# Patient Record
Sex: Female | Born: 1975 | Race: Black or African American | Hispanic: No | Smoking: Former smoker
Health system: Southern US, Community
[De-identification: ages and names within clinical notes are randomized; demographics above are authoritative.]

## PROBLEM LIST (undated history)

## (undated) DIAGNOSIS — G43909 Migraine, unspecified, not intractable, without status migrainosus: Secondary | ICD-10-CM

## (undated) DIAGNOSIS — J45909 Unspecified asthma, uncomplicated: Secondary | ICD-10-CM

## (undated) DIAGNOSIS — I10 Essential (primary) hypertension: Secondary | ICD-10-CM

## (undated) HISTORY — PX: ABDOMINAL HYSTERECTOMY: SHX81

## (undated) HISTORY — PX: KNEE ARTHROSCOPY: SHX127

## (undated) HISTORY — PX: OVARIAN CYST REMOVAL: SHX89

## (undated) HISTORY — PX: CHOLECYSTECTOMY: SHX55

---

## 1998-08-03 ENCOUNTER — Emergency Department (HOSPITAL_COMMUNITY): Admission: EM | Admit: 1998-08-03 | Discharge: 1998-08-03 | Payer: Self-pay | Admitting: Emergency Medicine

## 1999-01-30 ENCOUNTER — Emergency Department (HOSPITAL_COMMUNITY): Admission: EM | Admit: 1999-01-30 | Discharge: 1999-01-30 | Payer: Self-pay | Admitting: *Deleted

## 2002-09-19 ENCOUNTER — Encounter: Admission: RE | Admit: 2002-09-19 | Discharge: 2002-09-19 | Payer: Self-pay | Admitting: Family Medicine

## 2003-03-06 ENCOUNTER — Encounter: Admission: RE | Admit: 2003-03-06 | Discharge: 2003-03-06 | Payer: Self-pay | Admitting: Family Medicine

## 2003-03-06 ENCOUNTER — Encounter (INDEPENDENT_AMBULATORY_CARE_PROVIDER_SITE_OTHER): Payer: Self-pay | Admitting: *Deleted

## 2003-03-25 ENCOUNTER — Ambulatory Visit (HOSPITAL_COMMUNITY): Admission: RE | Admit: 2003-03-25 | Discharge: 2003-03-25 | Payer: Self-pay | Admitting: Obstetrics and Gynecology

## 2003-10-08 ENCOUNTER — Encounter: Admission: RE | Admit: 2003-10-08 | Discharge: 2003-10-08 | Payer: Self-pay | Admitting: Obstetrics and Gynecology

## 2009-02-16 ENCOUNTER — Emergency Department (HOSPITAL_COMMUNITY): Admission: EM | Admit: 2009-02-16 | Discharge: 2009-02-16 | Payer: Self-pay | Admitting: Emergency Medicine

## 2009-02-27 ENCOUNTER — Emergency Department (HOSPITAL_COMMUNITY): Admission: EM | Admit: 2009-02-27 | Discharge: 2009-02-27 | Payer: Self-pay | Admitting: Emergency Medicine

## 2009-04-05 ENCOUNTER — Emergency Department (HOSPITAL_COMMUNITY): Admission: EM | Admit: 2009-04-05 | Discharge: 2009-04-05 | Payer: Self-pay | Admitting: Emergency Medicine

## 2009-04-13 ENCOUNTER — Emergency Department (HOSPITAL_COMMUNITY): Admission: EM | Admit: 2009-04-13 | Discharge: 2009-04-13 | Payer: Self-pay | Admitting: Emergency Medicine

## 2010-07-17 ENCOUNTER — Emergency Department (HOSPITAL_COMMUNITY)
Admission: EM | Admit: 2010-07-17 | Discharge: 2010-07-17 | Disposition: A | Discharge status: Home / Self Care | Payer: Medicaid Other | Attending: Emergency Medicine | Admitting: Emergency Medicine

## 2010-07-17 DIAGNOSIS — R110 Nausea: Secondary | ICD-10-CM | POA: Insufficient documentation

## 2010-07-17 DIAGNOSIS — E669 Obesity, unspecified: Secondary | ICD-10-CM | POA: Insufficient documentation

## 2010-07-17 DIAGNOSIS — F3289 Other specified depressive episodes: Secondary | ICD-10-CM | POA: Insufficient documentation

## 2010-07-17 DIAGNOSIS — H53149 Visual discomfort, unspecified: Secondary | ICD-10-CM | POA: Insufficient documentation

## 2010-07-17 DIAGNOSIS — I10 Essential (primary) hypertension: Secondary | ICD-10-CM | POA: Insufficient documentation

## 2010-07-17 DIAGNOSIS — F329 Major depressive disorder, single episode, unspecified: Secondary | ICD-10-CM | POA: Insufficient documentation

## 2010-07-17 DIAGNOSIS — G43909 Migraine, unspecified, not intractable, without status migrainosus: Secondary | ICD-10-CM | POA: Insufficient documentation

## 2010-07-17 DIAGNOSIS — J45909 Unspecified asthma, uncomplicated: Secondary | ICD-10-CM | POA: Insufficient documentation

## 2010-07-17 DIAGNOSIS — Z79899 Other long term (current) drug therapy: Secondary | ICD-10-CM | POA: Insufficient documentation

## 2010-07-24 ENCOUNTER — Emergency Department (HOSPITAL_COMMUNITY)
Admission: EM | Admit: 2010-07-24 | Discharge: 2010-07-24 | Disposition: A | Discharge status: Home / Self Care | Payer: Medicaid Other | Attending: Emergency Medicine | Admitting: Emergency Medicine

## 2010-07-24 DIAGNOSIS — M545 Low back pain, unspecified: Secondary | ICD-10-CM | POA: Insufficient documentation

## 2010-07-24 DIAGNOSIS — Z79899 Other long term (current) drug therapy: Secondary | ICD-10-CM | POA: Insufficient documentation

## 2010-07-24 DIAGNOSIS — F3289 Other specified depressive episodes: Secondary | ICD-10-CM | POA: Insufficient documentation

## 2010-07-24 DIAGNOSIS — I10 Essential (primary) hypertension: Secondary | ICD-10-CM | POA: Insufficient documentation

## 2010-07-24 DIAGNOSIS — J45909 Unspecified asthma, uncomplicated: Secondary | ICD-10-CM | POA: Insufficient documentation

## 2010-07-24 DIAGNOSIS — F329 Major depressive disorder, single episode, unspecified: Secondary | ICD-10-CM | POA: Insufficient documentation

## 2010-07-24 DIAGNOSIS — M5430 Sciatica, unspecified side: Secondary | ICD-10-CM | POA: Insufficient documentation

## 2010-07-24 DIAGNOSIS — M79609 Pain in unspecified limb: Secondary | ICD-10-CM | POA: Insufficient documentation

## 2010-07-24 DIAGNOSIS — R209 Unspecified disturbances of skin sensation: Secondary | ICD-10-CM | POA: Insufficient documentation

## 2010-08-20 ENCOUNTER — Emergency Department (HOSPITAL_COMMUNITY): Payer: Medicaid Other

## 2010-08-20 ENCOUNTER — Emergency Department (HOSPITAL_COMMUNITY)
Admission: EM | Admit: 2010-08-20 | Discharge: 2010-08-21 | Disposition: A | Discharge status: Home / Self Care | Payer: Medicaid Other | Attending: Emergency Medicine | Admitting: Emergency Medicine

## 2010-08-20 DIAGNOSIS — M79609 Pain in unspecified limb: Secondary | ICD-10-CM | POA: Insufficient documentation

## 2010-08-20 DIAGNOSIS — M545 Low back pain, unspecified: Secondary | ICD-10-CM | POA: Insufficient documentation

## 2010-08-20 DIAGNOSIS — M549 Dorsalgia, unspecified: Secondary | ICD-10-CM | POA: Insufficient documentation

## 2010-08-20 DIAGNOSIS — I10 Essential (primary) hypertension: Secondary | ICD-10-CM | POA: Insufficient documentation

## 2010-09-03 ENCOUNTER — Emergency Department (HOSPITAL_COMMUNITY)
Admission: EM | Admit: 2010-09-03 | Discharge: 2010-09-04 | Disposition: A | Discharge status: Home / Self Care | Payer: Medicaid Other | Attending: Emergency Medicine | Admitting: Emergency Medicine

## 2010-09-03 DIAGNOSIS — I10 Essential (primary) hypertension: Secondary | ICD-10-CM | POA: Insufficient documentation

## 2010-09-03 DIAGNOSIS — M549 Dorsalgia, unspecified: Secondary | ICD-10-CM | POA: Insufficient documentation

## 2010-09-03 DIAGNOSIS — R109 Unspecified abdominal pain: Secondary | ICD-10-CM | POA: Insufficient documentation

## 2010-09-03 DIAGNOSIS — J45909 Unspecified asthma, uncomplicated: Secondary | ICD-10-CM | POA: Insufficient documentation

## 2010-09-03 DIAGNOSIS — R110 Nausea: Secondary | ICD-10-CM | POA: Insufficient documentation

## 2010-09-03 DIAGNOSIS — F3289 Other specified depressive episodes: Secondary | ICD-10-CM | POA: Insufficient documentation

## 2010-09-03 DIAGNOSIS — R000 Tachycardia, unspecified: Secondary | ICD-10-CM | POA: Insufficient documentation

## 2010-09-03 DIAGNOSIS — F329 Major depressive disorder, single episode, unspecified: Secondary | ICD-10-CM | POA: Insufficient documentation

## 2010-09-03 DIAGNOSIS — N949 Unspecified condition associated with female genital organs and menstrual cycle: Secondary | ICD-10-CM | POA: Insufficient documentation

## 2010-09-03 LAB — BASIC METABOLIC PANEL
BUN: 3 mg/dL — ABNORMAL LOW (ref 6–23)
CO2: 24 mEq/L (ref 19–32)
Calcium: 8.7 mg/dL (ref 8.4–10.5)
Chloride: 104 mEq/L (ref 96–112)
Creatinine, Ser: 0.75 mg/dL (ref 0.4–1.2)
GFR calc Af Amer: >60 mL/min (ref >60)
GFR calc non Af Amer: >60 mL/min (ref >60)
Glucose, Bld: 104 mg/dL — ABNORMAL HIGH (ref 70–99)
Potassium: 4.2 mEq/L (ref 3.5–5.1)
Sodium: 132 mEq/L — ABNORMAL LOW (ref 135–145)

## 2010-09-03 LAB — WET PREP, GENITAL
Clue Cells Wet Prep HPF POC: NONE SEEN
Trich, Wet Prep: NONE SEEN
WBC, Wet Prep HPF POC: NONE SEEN
Yeast Wet Prep HPF POC: NONE SEEN

## 2010-09-03 LAB — CBC
HCT: 34.9 % — ABNORMAL LOW (ref 36.0–46.0)
Hemoglobin: 10.9 g/dL — ABNORMAL LOW (ref 12.0–15.0)
MCH: 22.9 pg — ABNORMAL LOW (ref 26.0–34.0)
MCHC: 31.2 g/dL (ref 30.0–36.0)
MCV: 73.3 fL — ABNORMAL LOW (ref 78.0–100.0)
Platelets: 332 10*3/uL (ref 150–400)
RBC: 4.76 MIL/uL (ref 3.87–5.11)
RDW: 16.1 % — ABNORMAL HIGH (ref 11.5–15.5)
WBC: 11.3 10*3/uL — ABNORMAL HIGH (ref 4.0–10.5)

## 2010-09-03 LAB — URINALYSIS, ROUTINE W REFLEX MICROSCOPIC
Bilirubin Urine: NEGATIVE
Glucose, UA: NEGATIVE mg/dL
Hgb urine dipstick: NEGATIVE
Ketones, ur: NEGATIVE mg/dL
Nitrite: NEGATIVE
Protein, ur: NEGATIVE mg/dL
Specific Gravity, Urine: 1.017 (ref 1.005–1.030)
Urobilinogen, UA: 1 mg/dL (ref 0.0–1.0)
pH: 8.5 — ABNORMAL HIGH (ref 5.0–8.0)

## 2010-09-03 LAB — DIFFERENTIAL
Basophils Absolute: 0 10*3/uL (ref 0.0–0.1)
Basophils Relative: 0 % (ref 0–1)
Eosinophils Absolute: 0.1 10*3/uL (ref 0.0–0.7)
Eosinophils Relative: 1 % (ref 0–5)
Lymphocytes Relative: 13 % (ref 12–46)
Lymphs Abs: 1.4 10*3/uL (ref 0.7–4.0)
Monocytes Absolute: 0.6 10*3/uL (ref 0.1–1.0)
Monocytes Relative: 5 % (ref 3–12)
Neutro Abs: 9.1 10*3/uL — ABNORMAL HIGH (ref 1.7–7.7)
Neutrophils Relative %: 81 % — ABNORMAL HIGH (ref 43–77)

## 2010-09-03 LAB — POCT PREGNANCY, URINE: Preg Test, Ur: NEGATIVE

## 2010-09-06 LAB — GC/CHLAMYDIA PROBE AMP, GENITAL
Chlamydia, DNA Probe: UNDETERMINED
GC Probe Amp, Genital: NEGATIVE

## 2010-09-30 NOTE — Group Therapy Note (Signed)
NAME:  Regina [name], Regina [name] NO.:  [id number]   MEDICAL RECORD NO.:  [id number]                   PATIENT TYPE:  OUT   LOCATION:  [facility]                           FACILITY:  [facility]   PHYSICIAN:  Argentina Donovan, MD                     DATE OF BIRTH:  1976/01/01   DATE OF SERVICE:  10/08/2003                                    CLINIC NOTE   REASON FOR VISIT:  The patient is a 35 year old gravida 1 para 1-0-0-1 by  cesarean section that was done because of hypertension and the baby is 23  years old.  Since the birth of her last baby she has been having  intermittent suprapubic pain mainly in the area of the previous scar in the  mid portion of the lower abdomen with what she describes as an increasingly-  severe pressure that often is unpredictable in its frequency and it gets to  the point that she must get off her feet, the pressure becomes so severe.  It is not a stabbing pain but a very, very severe pressure.  It does not  cause increasing urinary frequency and is not helped by voiding.  She has  seen a urologist in the past whose examination was negative but in order to  rule out interstitial cystitis which he said it may come to, he felt the  patient would have to be put to sleep and the bladder dilated.  The pain  does seem to increase, however, when the bladder is really full.  It is not  related to her periods and pelvic examination and ultrasound done some time  ago was negative, yet the patient continues to have these problems.  It has  gotten more frequent recently and she has come in to evaluate it.   PHYSICAL EXAMINATION:  The abdomen is soft, flat, nontender, no masses, no  organomegaly.  The patient is not really having the pain at this time but  she does point to the mid portion of the suprapubic area just near the  surgical scar.  On pelvic examination, bimanual examination, we can move the  uterus and push on the adnexa and she says this is not  where the pain is.  When we push on the bladder in the area of the anterior bladder and urethra,  this does not duplicate the pain, but higher up in the vagina on the  anterior wall she says that is what the pain feels like and that is where it  is.   My impression is this probably is interstitial cystitis and the patient has  been seen so frequently I think it is worth trying her on [place].  We are  going to give her 100 mg t.i.d. with the instructions to take it as dictated  by the PDR.  I want the patient to return in 2 months to see if she has  been  helped by this.  If not, I think that perhaps she deserves at least a  laparoscopy and perhaps even an exam under anesthesia by a urologist,  perhaps both done at the same time.   IMPRESSION:  Abdominal pain of unknown etiology, probable interstitial  cystitis.                                               Argentina Donovan, MD    PR/MEDQ  D:  10/08/2003  T:  10/08/2003  Job:  [id number]

## 2010-09-30 NOTE — Group Therapy Note (Signed)
   Regina, [name] NO.:  [id number]   MEDICAL RECORD NO.:  [id number]                   PATIENT TYPE:  OUT   LOCATION:  [facility]                           FACILITY:  [facility]   PHYSICIAN:  Regina Parents, MD                 DATE OF BIRTH:  September 11, 1975   DATE OF SERVICE:                                    CLINIC NOTE   HISTORY OF PRESENT ILLNESS:  This 35 year old gravida 1, para 1 returns for  follow-up of pelvic pain.  The pain has been going on for approximately four  to six months.  The patient denies any history of pelvic inflammatory  disease, although she was treated for a course of PID as noted in Tinnie Gens, MD history of Sep 19, 2002.  The patient states that she has urinary  frequency, nocturia x5, and occasional dysuria and she also has suprapubic  discomfort when she has a full bladder that she interprets as pain as well  as fullness.  A pelvic ultrasound done in [facility] on August 19, 2002  revealed a 2 x 2 cm follicle cyst of the right ovary and also described both  ovaries being adherent to the posterior leaf of the broad ligament.   PHYSICAL EXAMINATION:  PELVIC:  Vagina is clean.  Cervix is nulliparous and  clean.  The uterus is normal size, nontender, mobile.  Both adnexa are soft  without masses, induration, nodularity, or tenderness.  There is some  suprapubic tenderness to the bladder on compression that the patient  interprets as not only pressure, but also discomfort.   ASSESSMENT/PLAN:  Proposed diagnostic studies will include a repeat pelvic  ultrasound at the patient's request to determine the status of that right  follicle cyst, a clean catch urine for culture, and a referral to urologist  to rule out interstitial cystitis.  If the pelvic ultrasound and the culture  are positive the patient will be notified.  Otherwise, she is to see the  urologist as stated previously.     Regina Parents, MD    SA/MEDQ  D:  03/06/2003  T:  03/06/2003  Job:  [id number]

## 2012-07-05 IMAGING — CR DG LUMBAR SPINE COMPLETE 4+V
5 series · 5 of 5 positions shown · non-contrast
Comparison: None.

CLINICAL DATA: Fall 1 week ago.  Low back and bilateral leg pain.

LUMBAR SPINE - COMPLETE 4+ VIEW

[t l-spine a.p.]
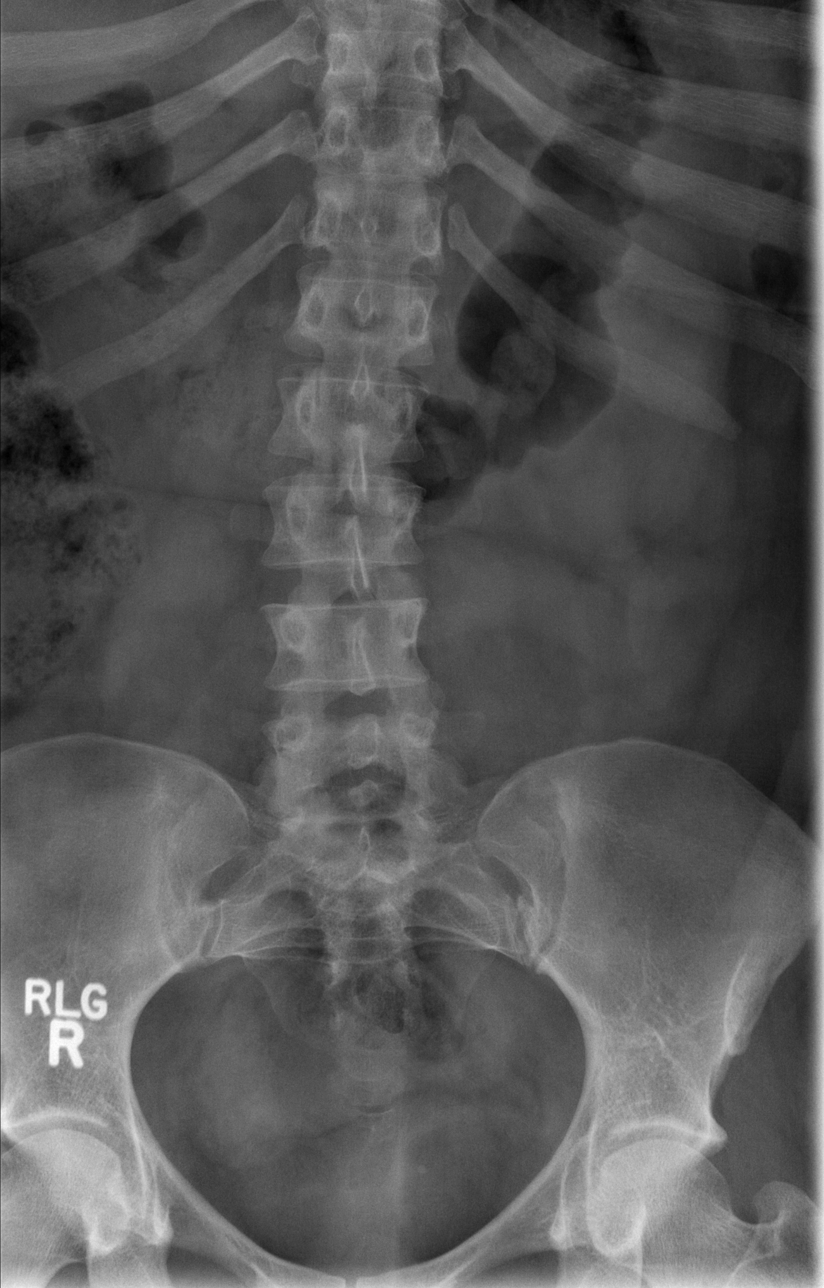

[t l-spine oblique exposure (1 of 2)]
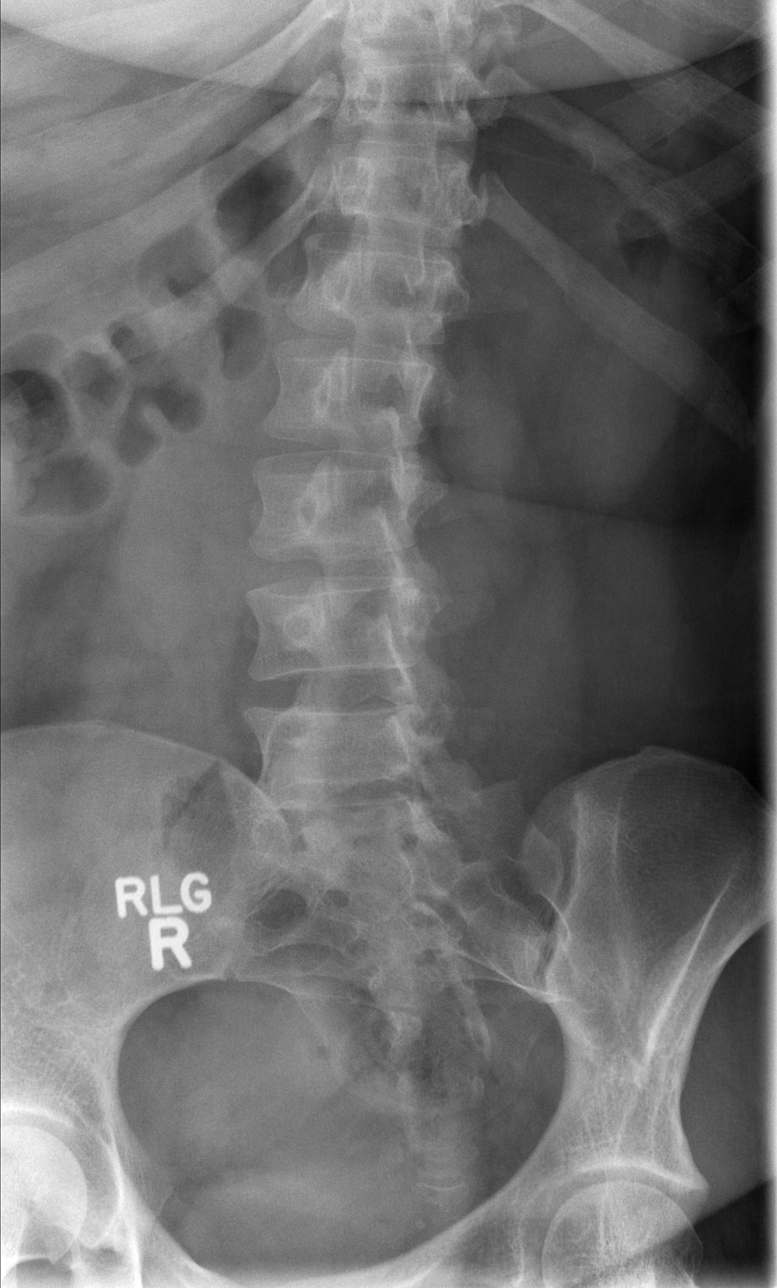

[t l-spine oblique exposure (2 of 2)]
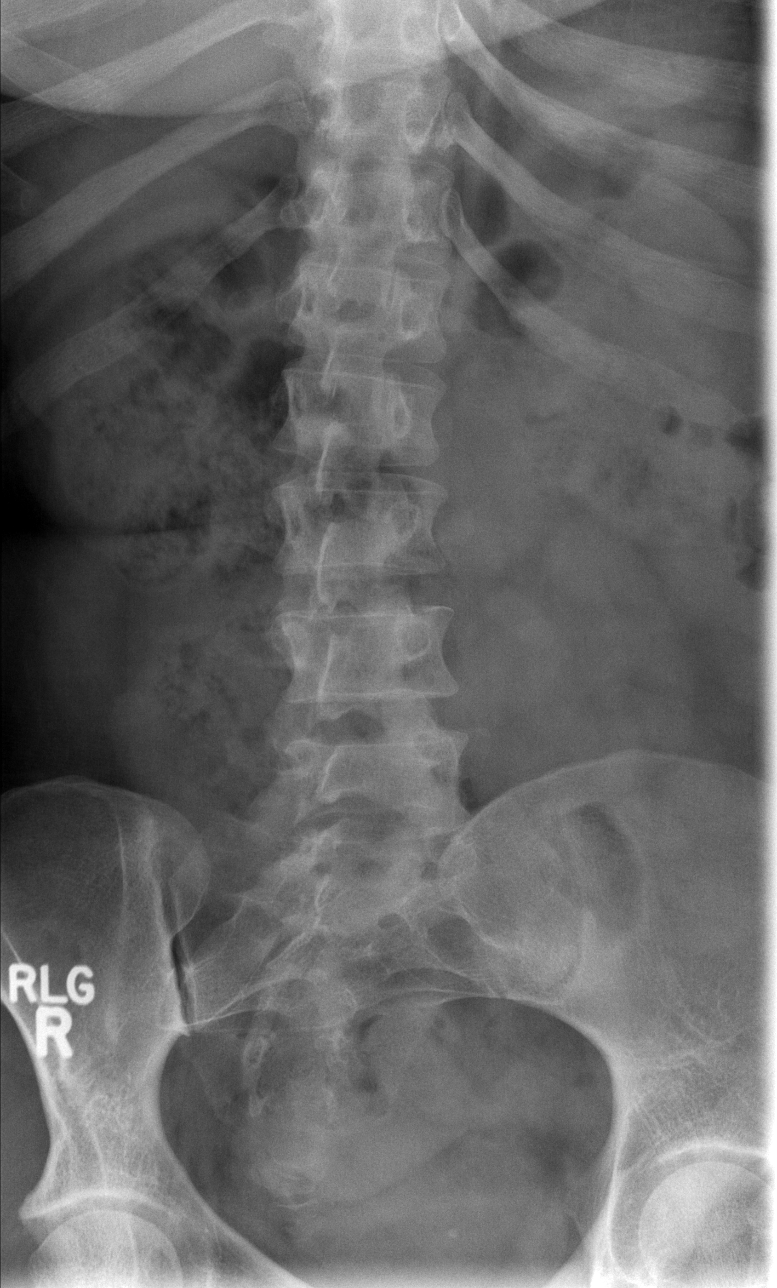

[t l-spine lat]
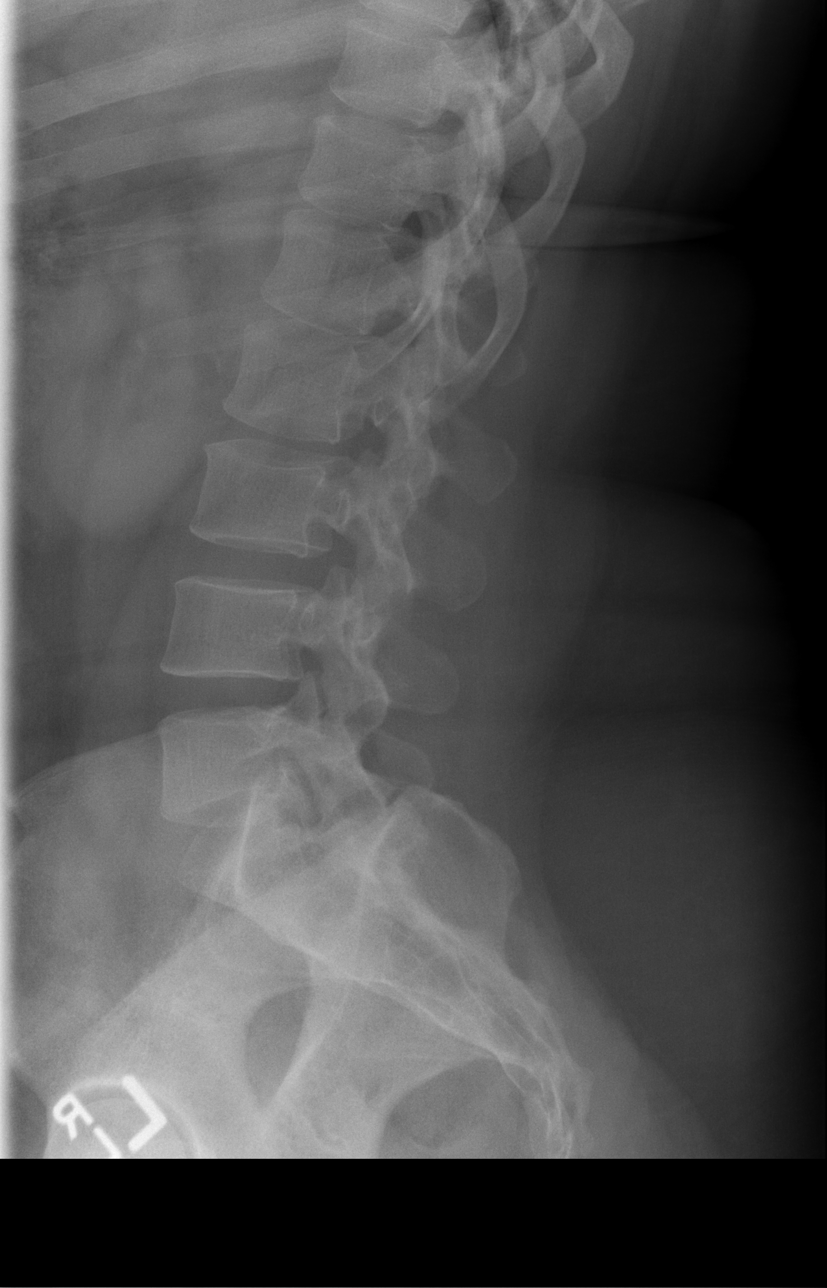

[t l-spine l5-s1 spot]
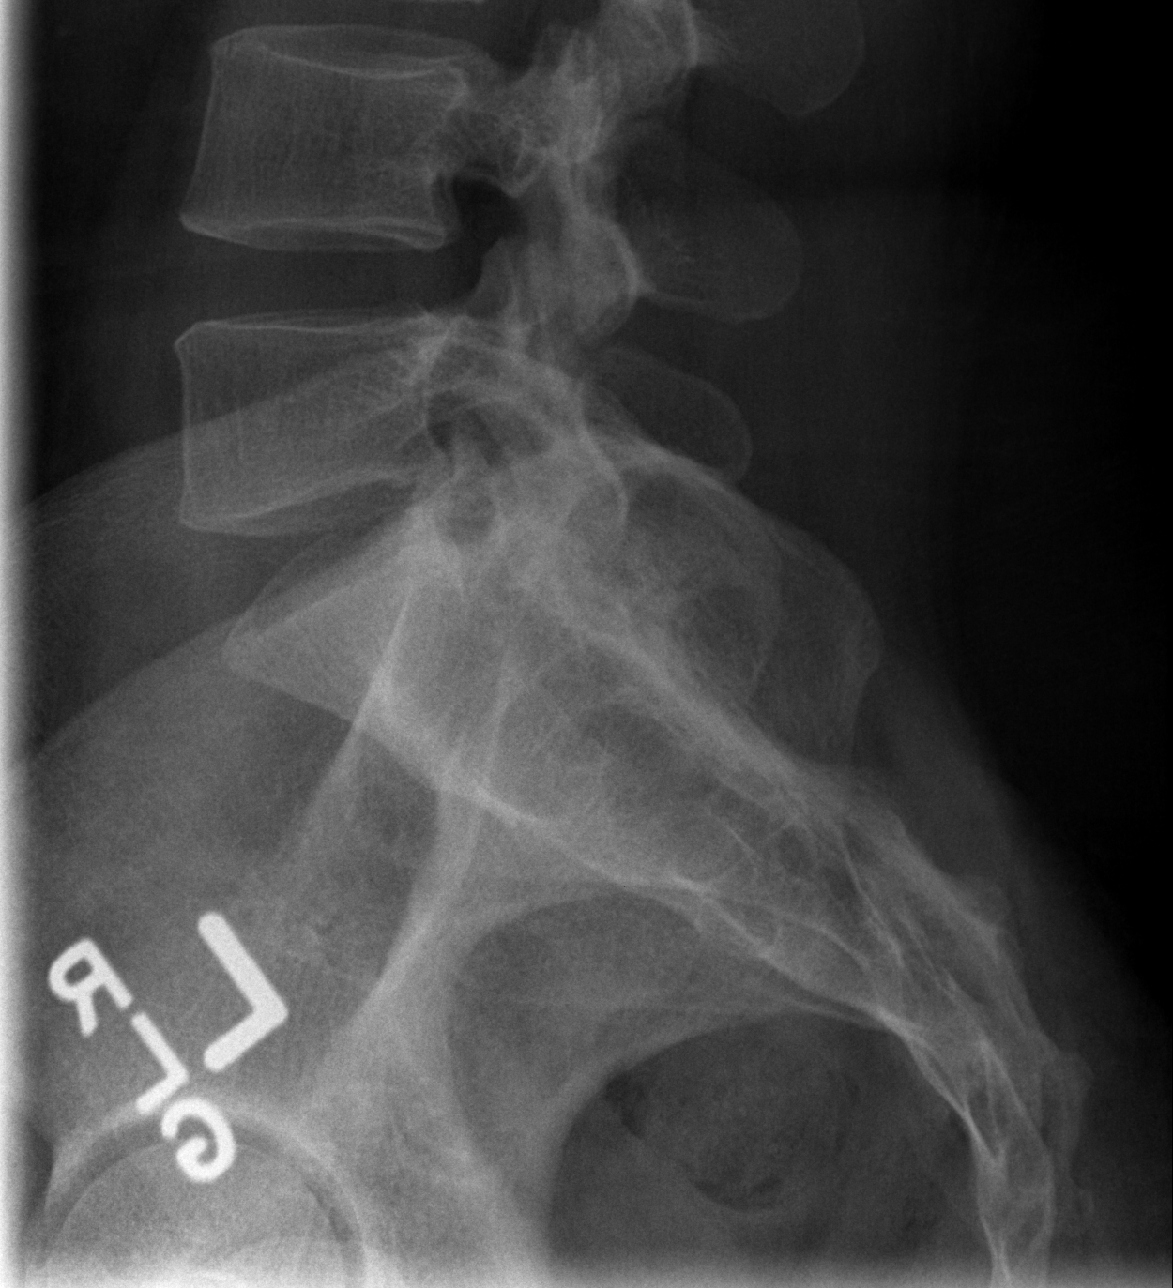

[5 of 5 positions shown; findings below may reference images not displayed]

FINDINGS: Four views study shows no acute fracture.  No
subluxation.  Intervertebral disc spaces are preserved.  The facets
are well-aligned bilaterally.  SI joints are normal in appearance.
IMPRESSION: No acute bony findings.

## 2014-04-03 ENCOUNTER — Emergency Department (HOSPITAL_BASED_OUTPATIENT_CLINIC_OR_DEPARTMENT_OTHER)
Admission: EM | Admit: 2014-04-03 | Discharge: 2014-04-03 | Disposition: A | Discharge status: Home / Self Care | Payer: Medicaid Other | Attending: Emergency Medicine | Admitting: Emergency Medicine

## 2014-04-03 ENCOUNTER — Encounter (HOSPITAL_BASED_OUTPATIENT_CLINIC_OR_DEPARTMENT_OTHER): Payer: Self-pay

## 2014-04-03 DIAGNOSIS — E669 Obesity, unspecified: Secondary | ICD-10-CM | POA: Diagnosis not present

## 2014-04-03 DIAGNOSIS — R51 Headache: Secondary | ICD-10-CM | POA: Diagnosis present

## 2014-04-03 DIAGNOSIS — G43909 Migraine, unspecified, not intractable, without status migrainosus: Secondary | ICD-10-CM | POA: Insufficient documentation

## 2014-04-03 DIAGNOSIS — Z791 Long term (current) use of non-steroidal anti-inflammatories (NSAID): Secondary | ICD-10-CM | POA: Insufficient documentation

## 2014-04-03 DIAGNOSIS — I10 Essential (primary) hypertension: Secondary | ICD-10-CM | POA: Insufficient documentation

## 2014-04-03 DIAGNOSIS — Z7951 Long term (current) use of inhaled steroids: Secondary | ICD-10-CM | POA: Diagnosis not present

## 2014-04-03 DIAGNOSIS — Z79899 Other long term (current) drug therapy: Secondary | ICD-10-CM | POA: Diagnosis not present

## 2014-04-03 DIAGNOSIS — Z880 Allergy status to penicillin: Secondary | ICD-10-CM | POA: Diagnosis not present

## 2014-04-03 DIAGNOSIS — J45909 Unspecified asthma, uncomplicated: Secondary | ICD-10-CM | POA: Diagnosis not present

## 2014-04-03 HISTORY — DX: Unspecified asthma, uncomplicated: J45.909

## 2014-04-03 HISTORY — DX: Essential (primary) hypertension: I10

## 2014-04-03 HISTORY — DX: Migraine, unspecified, not intractable, without status migrainosus: G43.909

## 2014-04-03 MED ORDER — METOCLOPRAMIDE HCL 5 MG/ML IJ SOLN
10.0000 mg | Freq: Once | INTRAMUSCULAR | Status: AC
  Administered 2014-04-03: 10 mg via INTRAVENOUS
  Filled 2014-04-03: qty 2

## 2014-04-03 MED ORDER — DIPHENHYDRAMINE HCL 50 MG/ML IJ SOLN
25.0000 mg | Freq: Once | INTRAMUSCULAR | Status: AC
  Administered 2014-04-03: 25 mg via INTRAVENOUS
  Filled 2014-04-03: qty 1

## 2014-04-03 MED ORDER — KETOROLAC TROMETHAMINE 30 MG/ML IJ SOLN
30.0000 mg | Freq: Once | INTRAMUSCULAR | Status: AC
  Administered 2014-04-03: 30 mg via INTRAVENOUS
  Filled 2014-04-03: qty 1

## 2014-04-03 MED ORDER — SODIUM CHLORIDE 0.9 % IV BOLUS (SEPSIS)
1000.0000 mL | Freq: Once | INTRAVENOUS | Status: AC
  Administered 2014-04-03: 1000 mL via INTRAVENOUS

## 2014-04-03 NOTE — ED Provider Notes (Signed)
CSN: [id number]     Arrival date & time 04/03/14  [id number] History   First MD Initiated Contact with Patient 04/03/14 1202     Chief Complaint  Patient presents with  . Headache     (Consider location/radiation/quality/duration/timing/severity/associated sxs/prior Treatment) HPI   38 year old female with history of hypertension, recurrent headache, and asthma who presents complaining of headache. Patient reports gradual onset of sharp throbbing headache throughout her head ongoing for the past 2 days. Headache is currently 8 out of 10, persistent, with light and sound sensitivity. Patient reports nausea without vomiting. She reported occasional shortness of breath and wheezing. She reports having pins and needles feeling in both eyes. Symptoms felt similar to prior migraine headache. She tries taking her home headache cocktail including Topamax, diclofenac with minimal improvement. She denies having any fever, chills, double vision, neck stiffness, difficulty thinking, slurring of speech, numbness weakness or rash. She has a neurologist. She is currently on her menstrual period and reported heavy flow with clots. This is similar to last month.  Past Medical History  Diagnosis Date  . Asthma   . Migraine   . Hypertension    Past Surgical History  Procedure Laterality Date  . Ovarian cyst removal    . Cholecystectomy    . Cesarean section    . Knee arthroscopy     No family history on file. History  Substance Use Topics  . Smoking status: Never Smoker   . Smokeless tobacco: Not on file  . Alcohol Use: No   OB History    No data available     Review of Systems  All other systems reviewed and are negative.     Allergies  Penicillins  Home Medications   Prior to Admission medications   Medication Sig Start Date End Date Taking? Authorizing Provider  albuterol (PROVENTIL HFA;VENTOLIN HFA) 108 (90 BASE) MCG/ACT inhaler Inhale into the lungs every 6 (six) hours as needed for  wheezing or shortness of breath.   Yes Historical Provider, MD  beclomethasone (QVAR) 40 MCG/ACT inhaler Inhale into the lungs 2 (two) times daily.   Yes Historical Provider, MD  diclofenac (VOLTAREN) 75 MG EC tablet Take 75 mg by mouth 2 (two) times daily.   Yes Historical Provider, MD  topiramate (TOPAMAX) 25 MG capsule Take 25 mg by mouth 2 (two) times daily.   Yes Historical Provider, MD   BP 160/96 mmHg  Pulse 89  Temp(Src) 98 F (36.7 C) (Oral)  Resp 20  Ht 5\' 2"  (1.575 m)  Wt 240 lb (108.863 kg)  BMI 43.89 kg/m2  SpO2 100%  LMP 04/03/2014 Physical Exam  Constitutional: She is oriented to person, place, and time. She appears well-developed and well-nourished. No distress.  Moderately obese African-American female is appears to be in no acute distress, nontoxic in appearance.  HENT:  Head: Atraumatic.  Eyes: Conjunctivae and EOM are normal. Pupils are equal, round, and reactive to light.  Neck: Neck supple.  No nuchal rigidity  Cardiovascular: Normal rate and regular rhythm.   Pulmonary/Chest: Effort normal.  Abdominal: Soft. There is no tenderness.  Neurological: She is alert and oriented to person, place, and time.  Neurologic exam:  Speech clear, pupils equal round reactive to light, extraocular movements intact  Normal peripheral visual fields Cranial nerves III through XII normal including no facial droop Follows commands, moves all extremities x4, normal strength to bilateral upper and lower extremities at all major muscle groups including grip Sensation normal to light touch  Coordination intact, no limb ataxia, finger-nose-finger normal Rapid alternating movements normal No pronator drift Gait normal   Skin: No rash noted.  Psychiatric: She has a normal mood and affect.  Nursing note and vitals reviewed.   ED Course  Procedures (including critical care time)  Headache similar to previous, no fever, neck stiffness, neuro findings or new symptoms to suggest  more serious etiology.  I don't think SAH, ICH, meningitis, encephalitis, mass at this time.  No recent trauma.  I don't feel imaging necessary at this time.  Plan to control symptoms.   Labs Review Labs Reviewed - No data to display  Imaging Review No results found.   EKG Interpretation None      MDM   Final diagnoses:  Migraine without status migrainosus, not intractable, unspecified migraine type    BP 142/87 mmHg  Pulse 85  Temp(Src) 98 F (36.7 C) (Oral)  Resp 18  Ht 5\' 2"  (1.575 m)  Wt 240 lb (108.863 kg)  BMI 43.89 kg/m2  SpO2 99%  LMP 04/03/2014     Fayrene HelperBowie Rajvir Ernster, PA-C 04/03/14 2204  Rolan BuccoMelanie Belfi, MD 04/08/14 [phone]

## 2014-04-03 NOTE — ED Notes (Signed)
Pt reports migraine that started two days ago without relief from migraine meds.  Pt also reports shortness of breath and wheezing.  No distress noted in triage.  Normal RR and unlabored breathing.  Pt reports pin feeling in eyes.  Reports this is similar to previous migraines.

## 2014-04-03 NOTE — Discharge Instructions (Signed)

## 2014-04-03 NOTE — ED Notes (Signed)
Pt also is on her period and sts that she is having an usual number of clots but no more pain than usual.

## 2015-10-25 ENCOUNTER — Emergency Department (HOSPITAL_BASED_OUTPATIENT_CLINIC_OR_DEPARTMENT_OTHER)
Admission: EM | Admit: 2015-10-25 | Discharge: 2015-10-26 | Disposition: A | Discharge status: Home / Self Care | Payer: Medicaid Other | Attending: Emergency Medicine | Admitting: Emergency Medicine

## 2015-10-25 ENCOUNTER — Encounter (HOSPITAL_BASED_OUTPATIENT_CLINIC_OR_DEPARTMENT_OTHER): Payer: Self-pay | Admitting: *Deleted

## 2015-10-25 DIAGNOSIS — I10 Essential (primary) hypertension: Secondary | ICD-10-CM | POA: Diagnosis not present

## 2015-10-25 DIAGNOSIS — R1013 Epigastric pain: Secondary | ICD-10-CM | POA: Diagnosis not present

## 2015-10-25 DIAGNOSIS — J45909 Unspecified asthma, uncomplicated: Secondary | ICD-10-CM | POA: Diagnosis not present

## 2015-10-25 DIAGNOSIS — Z79899 Other long term (current) drug therapy: Secondary | ICD-10-CM | POA: Diagnosis not present

## 2015-10-25 DIAGNOSIS — R1084 Generalized abdominal pain: Secondary | ICD-10-CM | POA: Diagnosis present

## 2015-10-25 LAB — URINALYSIS, ROUTINE W REFLEX MICROSCOPIC
Bilirubin Urine: NEGATIVE
Glucose, UA: NEGATIVE mg/dL
Hgb urine dipstick: NEGATIVE
Ketones, ur: NEGATIVE mg/dL
Leukocytes, UA: NEGATIVE
Nitrite: NEGATIVE
Protein, ur: NEGATIVE mg/dL
Specific Gravity, Urine: 1.025 (ref 1.005–1.030)
pH: 6 (ref 5.0–8.0)

## 2015-10-25 LAB — PREGNANCY, URINE: Preg Test, Ur: NEGATIVE

## 2015-10-25 MED ORDER — SUCRALFATE 1 G PO TABS
1.0000 g | ORAL_TABLET | Freq: Once | ORAL | Status: AC
  Administered 2015-10-26: 1 g via ORAL
  Filled 2015-10-25: qty 1

## 2015-10-25 MED ORDER — SODIUM CHLORIDE 0.9 % IV BOLUS (SEPSIS)
1000.0000 mL | Freq: Once | INTRAVENOUS | Status: AC
  Administered 2015-10-25: 1000 mL via INTRAVENOUS

## 2015-10-25 MED ORDER — ONDANSETRON HCL 4 MG/2ML IJ SOLN
4.0000 mg | Freq: Once | INTRAMUSCULAR | Status: AC
  Administered 2015-10-26: 4 mg via INTRAVENOUS
  Filled 2015-10-25: qty 2

## 2015-10-25 NOTE — ED Provider Notes (Signed)
CSN: [id number]     Arrival date & time 10/25/15  2230 History  By signing my name below, I, Soijett Blue, attest that this documentation has been prepared under the direction and in the presence of Paula LibraJohn Mandi Mattioli, MD. Electronically Signed: Soijett Blue, ED Scribe. 10/25/2015. 11:30 PM.   Chief Complaint  Patient presents with  . Abdominal Cramping     The history is provided by the patient. No language interpreter was used.    HPI Comments: Regina [name] is a 40 y.o. female who presents to the Emergency Department complaining of constant, dull, generalized, abdominal cramping onset 3 days ago. She reports that she has had an endoscopy and upper GI series for her symptoms. A nodule was biopsied; she has not been informed of the results of her biopsy yet. Pt notes that she had her gallbladder removed in 2012. Her abdominal pain is worsened following eating. She states that she is having associated symptoms of nausea, appetite change, and loose stools. She states that she been taking omeprazole and Bentyl with no relief for her symptoms. She denies vomiting or diarrhea.   Past Medical History  Diagnosis Date  . Asthma   . Migraine   . Hypertension    Past Surgical History  Procedure Laterality Date  . Ovarian cyst removal    . Cholecystectomy    . Cesarean section    . Knee arthroscopy     History reviewed. No pertinent family history. Social History  Substance Use Topics  . Smoking status: Never Smoker   . Smokeless tobacco: None  . Alcohol Use: No   OB History    No data available     Review of Systems  A complete 10 system review of systems was obtained and all systems are negative except as noted in the HPI and PMH.   Allergies  Penicillins  Home Medications   Prior to Admission medications   Medication Sig Start Date End Date Taking? Authorizing Provider  dicyclomine (BENTYL) 20 MG tablet Take 20 mg by mouth every 6 (six) hours.   Yes Historical Provider, MD   omeprazole (PRILOSEC) 40 MG capsule Take 40 mg by mouth daily.   Yes Historical Provider, MD  albuterol (PROVENTIL HFA;VENTOLIN HFA) 108 (90 BASE) MCG/ACT inhaler Inhale into the lungs every 6 (six) hours as needed for wheezing or shortness of breath.    Historical Provider, MD  beclomethasone (QVAR) 40 MCG/ACT inhaler Inhale into the lungs 2 (two) times daily.    Historical Provider, MD  diclofenac (VOLTAREN) 75 MG EC tablet Take 75 mg by mouth 2 (two) times daily.    Historical Provider, MD  topiramate (TOPAMAX) 25 MG capsule Take 25 mg by mouth 2 (two) times daily.    Historical Provider, MD   BP 163/99 mmHg  Pulse 85  Temp(Src) 98.8 F (37.1 C)  Resp 18  Ht 5\' 2"  (1.575 m)  Wt 256 lb (116.121 kg)  BMI 46.81 kg/m2  SpO2 100%  LMP 10/05/2015   Physical Exam General: Well-developed, well-nourished female in no acute distress; appearance consistent with age of record HENT: normocephalic; atraumatic Eyes: pupils equal, round and reactive to light; extraocular muscles intact Neck: supple Heart: regular rate and rhythm Lungs: clear to auscultation bilaterally Abdomen: soft; nondistended; epigastric and periumbilical tenderness; no masses or hepatosplenomegaly; bowel sounds present Extremities: No deformity; full range of motion; pulses normal Neurologic: Awake, alert and oriented; motor function intact in all extremities and symmetric; no facial droop Skin: Warm and  dry Psychiatric: Normal mood and affect   ED Course  Procedures (including critical care time)   MDM   Nursing notes and vitals signs, including pulse oximetry, reviewed.  Summary of this visit's results, reviewed by myself:  Labs:  Results for orders placed or performed during the hospital encounter of 10/25/15 (from the past 24 hour(s))  Pregnancy, urine     Status: None   Collection Time: 10/25/15 10:40 PM  Result Value Ref Range   Preg Test, Ur NEGATIVE NEGATIVE  Urinalysis, Routine w reflex microscopic  (not at [facility])     Status: Abnormal   Collection Time: 10/25/15 10:40 PM  Result Value Ref Range   Color, Urine YELLOW YELLOW   APPearance CLOUDY (A) CLEAR   Specific Gravity, Urine 1.025 1.005 - 1.030   pH 6.0 5.0 - 8.0   Glucose, UA NEGATIVE NEGATIVE mg/dL   Hgb urine dipstick NEGATIVE NEGATIVE   Bilirubin Urine NEGATIVE NEGATIVE   Ketones, ur NEGATIVE NEGATIVE mg/dL   Protein, ur NEGATIVE NEGATIVE mg/dL   Nitrite NEGATIVE NEGATIVE   Leukocytes, UA NEGATIVE NEGATIVE  CBC with Differential/Platelet     Status: Abnormal   Collection Time: 10/25/15 11:55 PM  Result Value Ref Range   WBC 8.6 4.0 - 10.5 K/uL   RBC 5.20 (H) 3.87 - 5.11 MIL/uL   Hemoglobin 12.3 12.0 - 15.0 g/dL   HCT 78.2 95.6 - 21.3 %   MCV 74.6 (L) 78.0 - 100.0 fL   MCH 23.7 (L) 26.0 - 34.0 pg   MCHC 31.7 30.0 - 36.0 g/dL   RDW 08.6 (H) 57.8 - 46.9 %   Platelets 288 150 - 400 K/uL   Neutrophils Relative % 61 %   Neutro Abs 5.2 1.7 - 7.7 K/uL   Lymphocytes Relative 27 %   Lymphs Abs 2.3 0.7 - 4.0 K/uL   Monocytes Relative 9 %   Monocytes Absolute 0.8 0.1 - 1.0 K/uL   Eosinophils Relative 3 %   Eosinophils Absolute 0.3 0.0 - 0.7 K/uL   Basophils Relative 0 %   Basophils Absolute 0.0 0.0 - 0.1 K/uL  Comprehensive metabolic panel     Status: Abnormal   Collection Time: 10/25/15 11:55 PM  Result Value Ref Range   Sodium 135 135 - 145 mmol/L   Potassium 3.9 3.5 - 5.1 mmol/L   Chloride 106 101 - 111 mmol/L   CO2 24 22 - 32 mmol/L   Glucose, Bld 105 (H) 65 - 99 mg/dL   BUN 8 6 - 20 mg/dL   Creatinine, Ser 6.29 0.44 - 1.00 mg/dL   Calcium 9.3 8.9 - 52.8 mg/dL   Total Protein 7.0 6.5 - 8.1 g/dL   Albumin 3.9 3.5 - 5.0 g/dL   AST 16 15 - 41 U/L   ALT 18 14 - 54 U/L   Alkaline Phosphatase 65 38 - 126 U/L   Total Bilirubin 0.3 0.3 - 1.2 mg/dL   GFR calc non Af Amer >60 >60 mL/min   GFR calc Af Amer >60 >60 mL/min   Anion gap 5 5 - 15  Lipase, blood     Status: None   Collection Time: 10/25/15 11:55 PM  Result  Value Ref Range   Lipase 27 11 - 51 U/L   12:39 AM Partial relief with Carafate orally. Will start patient on Carafate. She is already on Bentyl and Prilosec.  Final diagnoses:  Epigastric pain   I personally performed the services described in this documentation, which was scribed  in my presence. The recorded information has been reviewed and is accurate.     Paula Libra, MD 10/26/15 [phone]

## 2015-10-25 NOTE — ED Notes (Signed)
pt c/o diffuse abd pain and nausea  X 3 days

## 2015-10-26 LAB — CBC WITH DIFFERENTIAL/PLATELET
Basophils Absolute: 0 10*3/uL (ref 0.0–0.1)
Basophils Relative: 0 %
Eosinophils Absolute: 0.3 10*3/uL (ref 0.0–0.7)
Eosinophils Relative: 3 %
HCT: 38.8 % (ref 36.0–46.0)
Hemoglobin: 12.3 g/dL (ref 12.0–15.0)
Lymphocytes Relative: 27 %
Lymphs Abs: 2.3 10*3/uL (ref 0.7–4.0)
MCH: 23.7 pg — ABNORMAL LOW (ref 26.0–34.0)
MCHC: 31.7 g/dL (ref 30.0–36.0)
MCV: 74.6 fL — ABNORMAL LOW (ref 78.0–100.0)
Monocytes Absolute: 0.8 10*3/uL (ref 0.1–1.0)
Monocytes Relative: 9 %
Neutro Abs: 5.2 10*3/uL (ref 1.7–7.7)
Neutrophils Relative %: 61 %
Platelets: 288 10*3/uL (ref 150–400)
RBC: 5.2 MIL/uL — ABNORMAL HIGH (ref 3.87–5.11)
RDW: 19.5 % — ABNORMAL HIGH (ref 11.5–15.5)
WBC: 8.6 10*3/uL (ref 4.0–10.5)

## 2015-10-26 LAB — COMPREHENSIVE METABOLIC PANEL
ALT: 18 U/L (ref 14–54)
AST: 16 U/L (ref 15–41)
Albumin: 3.9 g/dL (ref 3.5–5.0)
Alkaline Phosphatase: 65 U/L (ref 38–126)
Anion gap: 5 (ref 5–15)
BUN: 8 mg/dL (ref 6–20)
CO2: 24 mmol/L (ref 22–32)
Calcium: 9.3 mg/dL (ref 8.9–10.3)
Chloride: 106 mmol/L (ref 101–111)
Creatinine, Ser: 0.73 mg/dL (ref 0.44–1.00)
GFR calc Af Amer: >60 mL/min (ref >60)
GFR calc non Af Amer: >60 mL/min (ref >60)
Glucose, Bld: 105 mg/dL — ABNORMAL HIGH (ref 65–99)
Potassium: 3.9 mmol/L (ref 3.5–5.1)
Sodium: 135 mmol/L (ref 135–145)
Total Bilirubin: 0.3 mg/dL (ref 0.3–1.2)
Total Protein: 7 g/dL (ref 6.5–8.1)

## 2015-10-26 LAB — LIPASE, BLOOD: Lipase: 27 U/L (ref 11–51)

## 2015-10-26 MED ORDER — SUCRALFATE 1 G PO TABS: ORAL_TABLET | ORAL | Status: AC

## 2016-06-03 ENCOUNTER — Encounter (HOSPITAL_BASED_OUTPATIENT_CLINIC_OR_DEPARTMENT_OTHER): Payer: Self-pay | Admitting: Emergency Medicine

## 2016-06-03 ENCOUNTER — Emergency Department (HOSPITAL_BASED_OUTPATIENT_CLINIC_OR_DEPARTMENT_OTHER)
Admission: EM | Admit: 2016-06-03 | Discharge: 2016-06-03 | Disposition: A | Discharge status: Home / Self Care | Payer: Medicaid Other | Attending: Dermatology | Admitting: Dermatology

## 2016-06-03 DIAGNOSIS — M549 Dorsalgia, unspecified: Secondary | ICD-10-CM | POA: Diagnosis not present

## 2016-06-03 DIAGNOSIS — Z5321 Procedure and treatment not carried out due to patient leaving prior to being seen by health care provider: Secondary | ICD-10-CM | POA: Diagnosis not present

## 2016-06-03 DIAGNOSIS — J45909 Unspecified asthma, uncomplicated: Secondary | ICD-10-CM | POA: Insufficient documentation

## 2016-06-03 DIAGNOSIS — I10 Essential (primary) hypertension: Secondary | ICD-10-CM | POA: Insufficient documentation

## 2016-06-03 DIAGNOSIS — Z79899 Other long term (current) drug therapy: Secondary | ICD-10-CM | POA: Insufficient documentation

## 2016-06-03 DIAGNOSIS — M542 Cervicalgia: Secondary | ICD-10-CM | POA: Insufficient documentation

## 2016-06-03 NOTE — ED Triage Notes (Signed)
Pt states neck pain without injury for past 2-3 days, along with muscle stiffness that today resulted in her neck "locking up".  Pt states h/o similar but not as severe.

## 2016-06-03 NOTE — ED Notes (Signed)
Pt called in waiting room x4 with no answer.

## 2016-06-03 NOTE — ED Triage Notes (Signed)
Pt took one zanaflex last night with no relief.

## 2017-05-04 ENCOUNTER — Other Ambulatory Visit: Payer: Self-pay

## 2017-05-04 ENCOUNTER — Emergency Department (HOSPITAL_BASED_OUTPATIENT_CLINIC_OR_DEPARTMENT_OTHER)
Admission: EM | Admit: 2017-05-04 | Discharge: 2017-05-04 | Disposition: A | Discharge status: Home / Self Care | Payer: Medicaid Other | Attending: Emergency Medicine | Admitting: Emergency Medicine

## 2017-05-04 ENCOUNTER — Encounter (HOSPITAL_BASED_OUTPATIENT_CLINIC_OR_DEPARTMENT_OTHER): Payer: Self-pay | Admitting: *Deleted

## 2017-05-04 DIAGNOSIS — J45909 Unspecified asthma, uncomplicated: Secondary | ICD-10-CM | POA: Diagnosis not present

## 2017-05-04 DIAGNOSIS — Z87891 Personal history of nicotine dependence: Secondary | ICD-10-CM | POA: Diagnosis not present

## 2017-05-04 DIAGNOSIS — R110 Nausea: Secondary | ICD-10-CM | POA: Insufficient documentation

## 2017-05-04 DIAGNOSIS — I10 Essential (primary) hypertension: Secondary | ICD-10-CM | POA: Insufficient documentation

## 2017-05-04 DIAGNOSIS — Z79899 Other long term (current) drug therapy: Secondary | ICD-10-CM | POA: Insufficient documentation

## 2017-05-04 DIAGNOSIS — R51 Headache: Secondary | ICD-10-CM | POA: Insufficient documentation

## 2017-05-04 DIAGNOSIS — R519 Headache, unspecified: Secondary | ICD-10-CM

## 2017-05-04 MED ORDER — KETOROLAC TROMETHAMINE 15 MG/ML IJ SOLN
15.0000 mg | Freq: Once | INTRAMUSCULAR | Status: AC
  Administered 2017-05-04: 15 mg via INTRAVENOUS
  Filled 2017-05-04: qty 1

## 2017-05-04 MED ORDER — METOCLOPRAMIDE HCL 5 MG/ML IJ SOLN
10.0000 mg | Freq: Once | INTRAMUSCULAR | Status: AC
  Administered 2017-05-04: 10 mg via INTRAVENOUS
  Filled 2017-05-04: qty 2

## 2017-05-04 MED ORDER — DEXAMETHASONE SODIUM PHOSPHATE 10 MG/ML IJ SOLN
10.0000 mg | Freq: Once | INTRAMUSCULAR | Status: AC
  Administered 2017-05-04: 10 mg via INTRAVENOUS
  Filled 2017-05-04: qty 1

## 2017-05-04 MED ORDER — FENTANYL CITRATE (PF) 100 MCG/2ML IJ SOLN
100.0000 ug | Freq: Once | INTRAMUSCULAR | Status: AC
  Administered 2017-05-04: 100 ug via INTRAVENOUS
  Filled 2017-05-04: qty 2

## 2017-05-04 MED ORDER — SODIUM CHLORIDE 0.9 % IV BOLUS (SEPSIS)
1000.0000 mL | Freq: Once | INTRAVENOUS | Status: AC
  Administered 2017-05-04: 1000 mL via INTRAVENOUS

## 2017-05-04 NOTE — ED Triage Notes (Signed)
Lingering headache that never goes away for 3 weeks.

## 2017-05-04 NOTE — ED Notes (Signed)
Pt discharged to home with family. NAD.  

## 2017-05-04 NOTE — ED Provider Notes (Signed)
[facility] Provider Note: Lowella DellJ. Lane [name], MD, FACEP  CSN: [id number] MRN: [id number] ARRIVAL: 05/04/17 at 0035 ROOM: [facility]   CHIEF COMPLAINT  Headache   HISTORY OF PRESENT ILLNESS  05/04/17 2:22 AM Regina [name] is a 41 y.o. female with a history of migraines.  She is here with a headache for the past 3 weeks.  She has a history of similarly a prolonged migraines in the past.  The headache is described as generalized and dull.  She rates it as a 9 out of 10.  It is worse with palpation of her scalp, movement or exposure to light.  She has had nausea but no vomiting.  She has had a decreased appetite and oral intake.  She denies focal numbness or weakness.   Past Medical History:  Diagnosis Date  . Asthma   . Hypertension   . Migraine     Past Surgical History:  Procedure Laterality Date  . CESAREAN SECTION    . CHOLECYSTECTOMY    . KNEE ARTHROSCOPY    . OVARIAN CYST REMOVAL      History reviewed. No pertinent family history.  Social History   Tobacco Use  . Smoking status: Former Smoker    Last attempt to quit: 04/14/2012    Years since quitting: 5.0  . Smokeless tobacco: Never Used  Substance Use Topics  . Alcohol use: No  . Drug use: No    Prior to Admission medications   Medication Sig Start Date End Date Taking? Authorizing Provider  albuterol (PROVENTIL HFA;VENTOLIN HFA) 108 (90 BASE) MCG/ACT inhaler Inhale into the lungs every 6 (six) hours as needed for wheezing or shortness of breath.    [provider]  amLODipine (NORVASC) 10 MG tablet Take 10 mg by mouth daily.    [provider]  atenolol (TENORMIN) 50 MG tablet Take 50 mg by mouth daily.    [provider]  beclomethasone (QVAR) 40 MCG/ACT inhaler Inhale into the lungs 2 (two) times daily.    [provider]  diclofenac (VOLTAREN) 75 MG EC tablet Take 75 mg by mouth 2 (two) times daily.    [provider]  dicyclomine (BENTYL) 20 MG  tablet Take 20 mg by mouth every 6 (six) hours.    [provider]  omeprazole (PRILOSEC) 40 MG capsule Take 40 mg by mouth daily.    [provider]  sucralfate (CARAFATE) 1 g tablet Take one tablet 3 times daily with meals and again at bedtime. Take omeprazole at least 30 minutes before first dose of Carafate. 10/26/15   Hera Celaya, MD  tiZANidine (ZANAFLEX) 4 MG tablet Take 4 mg by mouth every 6 (six) hours as needed for muscle spasms.    [provider]  topiramate (TOPAMAX) 25 MG capsule Take 25 mg by mouth 2 (two) times daily.    [provider]    Allergies Penicillins   REVIEW OF SYSTEMS  Negative except as noted here or in the History of Present Illness.   PHYSICAL EXAMINATION  Initial Vital Signs Blood pressure 125/78, pulse (!) 101, temperature 98.3 F (36.8 C), temperature source Oral, resp. rate 16, height 5\' 1"  (1.549 m), weight 111.1 kg (245 lb), SpO2 98 %.  Examination General: Well-developed, well-nourished female in no acute distress; appearance consistent with age of record HENT: normocephalic; atraumatic Eyes: pupils equal, round and reactive to light; extraocular muscles intact; phobia Neck: supple Heart: regular rate and rhythm Lungs: clear to auscultation bilaterally Abdomen:  soft; nondistended; nontender; bowel sounds present Extremities: No deformity; full range of motion; pulses normal Neurologic: Awake, alert and oriented; motor function intact in all extremities and symmetric; no facial droop; normal coordination, speech and gait Skin: Warm and dry Psychiatric: Normal mood and affect   RESULTS  Summary of this visit's results, reviewed by myself:   EKG Interpretation  Date/Time:    Ventricular Rate:    PR Interval:    QRS Duration:   QT Interval:    QTC Calculation:   R Axis:     Text Interpretation:        Laboratory Studies: No results found for this or any previous visit (from the past 24  hour(s)). Imaging Studies: No results found.  ED COURSE  Nursing notes and initial vitals signs, including pulse oximetry, reviewed.  Vitals:   05/04/17 0047 05/04/17 0050  BP: 125/78   Pulse: (!) 101   Resp: 16   Temp: 98.3 F (36.8 C)   TempSrc: Oral   SpO2: 98%   Weight:  111.1 kg (245 lb)  Height:  5\' 1"  (1.549 m)   4:12 AM Headache improved but not resolved.  Patient advised that she has received a dose of steroids and due to the prolonged nature of her headache there is an inflammatory component that will require some time for resolution.  She has an appointment with her neurologist next week.  PROCEDURES    ED DIAGNOSES     ICD-10-CM   1. Persistent headaches R51        Timon Geissinger, Jonny RuizJohn, MD 05/04/17 [phone]

## 2018-07-11 ENCOUNTER — Encounter (HOSPITAL_BASED_OUTPATIENT_CLINIC_OR_DEPARTMENT_OTHER): Payer: Self-pay | Admitting: *Deleted

## 2018-07-11 ENCOUNTER — Other Ambulatory Visit: Payer: Self-pay

## 2018-07-11 ENCOUNTER — Emergency Department (HOSPITAL_BASED_OUTPATIENT_CLINIC_OR_DEPARTMENT_OTHER)
Admission: EM | Admit: 2018-07-11 | Discharge: 2018-07-11 | Disposition: A | Discharge status: Home / Self Care | Payer: Medicaid Other | Attending: Emergency Medicine | Admitting: Emergency Medicine

## 2018-07-11 DIAGNOSIS — J45909 Unspecified asthma, uncomplicated: Secondary | ICD-10-CM | POA: Insufficient documentation

## 2018-07-11 DIAGNOSIS — I10 Essential (primary) hypertension: Secondary | ICD-10-CM | POA: Insufficient documentation

## 2018-07-11 DIAGNOSIS — Z79899 Other long term (current) drug therapy: Secondary | ICD-10-CM | POA: Diagnosis not present

## 2018-07-11 DIAGNOSIS — Z87891 Personal history of nicotine dependence: Secondary | ICD-10-CM | POA: Diagnosis not present

## 2018-07-11 DIAGNOSIS — R42 Dizziness and giddiness: Secondary | ICD-10-CM | POA: Diagnosis not present

## 2018-07-11 DIAGNOSIS — G43909 Migraine, unspecified, not intractable, without status migrainosus: Secondary | ICD-10-CM | POA: Insufficient documentation

## 2018-07-11 DIAGNOSIS — R51 Headache: Secondary | ICD-10-CM | POA: Diagnosis present

## 2018-07-11 LAB — CBC
HCT: 39.8 % (ref 36.0–46.0)
HEMOGLOBIN: 12.3 g/dL (ref 12.0–15.0)
MCH: 26.1 pg (ref 26.0–34.0)
MCHC: 30.9 g/dL (ref 30.0–36.0)
MCV: 84.3 fL (ref 80.0–100.0)
Platelets: 263 10*3/uL (ref 150–400)
RBC: 4.72 MIL/uL (ref 3.87–5.11)
RDW: 12.8 % (ref 11.5–15.5)
WBC: 8.8 10*3/uL (ref 4.0–10.5)
nRBC: 0 % (ref 0.0–0.2)

## 2018-07-11 LAB — BASIC METABOLIC PANEL
ANION GAP: 5 (ref 5–15)
BUN: 12 mg/dL (ref 6–20)
CALCIUM: 9.2 mg/dL (ref 8.9–10.3)
CO2: 24 mmol/L (ref 22–32)
Chloride: 105 mmol/L (ref 98–111)
Creatinine, Ser: 0.67 mg/dL (ref 0.44–1.00)
GFR calc Af Amer: >60 mL/min (ref >60)
GFR calc non Af Amer: >60 mL/min (ref >60)
GLUCOSE: 113 mg/dL — AB (ref 70–99)
POTASSIUM: 3.7 mmol/L (ref 3.5–5.1)
Sodium: 134 mmol/L — ABNORMAL LOW (ref 135–145)

## 2018-07-11 LAB — URINALYSIS, ROUTINE W REFLEX MICROSCOPIC
BILIRUBIN URINE: NEGATIVE
Glucose, UA: NEGATIVE mg/dL
HGB URINE DIPSTICK: NEGATIVE
Ketones, ur: NEGATIVE mg/dL
Leukocytes,Ua: NEGATIVE
NITRITE: NEGATIVE
PH: 6 (ref 5.0–8.0)
Protein, ur: NEGATIVE mg/dL
SPECIFIC GRAVITY, URINE: 1.02 (ref 1.005–1.030)

## 2018-07-11 LAB — PREGNANCY, URINE: Preg Test, Ur: NEGATIVE

## 2018-07-11 MED ORDER — DIPHENHYDRAMINE HCL 50 MG/ML IJ SOLN
12.5000 mg | Freq: Once | INTRAMUSCULAR | Status: AC
  Administered 2018-07-11: 12.5 mg via INTRAVENOUS
  Filled 2018-07-11: qty 1

## 2018-07-11 MED ORDER — SODIUM CHLORIDE 0.9 % IV SOLN: INTRAVENOUS | Status: DC

## 2018-07-11 MED ORDER — MECLIZINE HCL 25 MG PO TABS
25.0000 mg | ORAL_TABLET | Freq: Once | ORAL | Status: AC
  Administered 2018-07-11: 25 mg via ORAL
  Filled 2018-07-11: qty 1

## 2018-07-11 MED ORDER — KETOROLAC TROMETHAMINE 15 MG/ML IJ SOLN
15.0000 mg | Freq: Once | INTRAMUSCULAR | Status: AC
  Administered 2018-07-11: 15 mg via INTRAVENOUS
  Filled 2018-07-11: qty 1

## 2018-07-11 MED ORDER — METOCLOPRAMIDE HCL 5 MG/ML IJ SOLN
10.0000 mg | Freq: Once | INTRAMUSCULAR | Status: AC
  Administered 2018-07-11: 10 mg via INTRAVENOUS
  Filled 2018-07-11: qty 2

## 2018-07-11 MED ORDER — SODIUM CHLORIDE 0.9 % IV BOLUS
1000.0000 mL | Freq: Once | INTRAVENOUS | Status: AC
  Administered 2018-07-11: 1000 mL via INTRAVENOUS

## 2018-07-11 MED ORDER — ONDANSETRON 4 MG PO TBDP: 4.0000 mg | ORAL_TABLET | Freq: Three times a day (TID) | ORAL | 0 refills | Status: AC | PRN

## 2018-07-11 MED ORDER — LORAZEPAM 2 MG/ML IJ SOLN
1.0000 mg | Freq: Once | INTRAMUSCULAR | Status: AC
  Administered 2018-07-11: 1 mg via INTRAVENOUS
  Filled 2018-07-11: qty 1

## 2018-07-11 MED ORDER — MORPHINE SULFATE (PF) 4 MG/ML IV SOLN
4.0000 mg | Freq: Once | INTRAVENOUS | Status: AC
  Administered 2018-07-11: 4 mg via INTRAVENOUS
  Filled 2018-07-11: qty 1

## 2018-07-11 MED ORDER — MECLIZINE HCL 25 MG PO TABS: 25.0000 mg | ORAL_TABLET | Freq: Three times a day (TID) | ORAL | 0 refills | Status: AC | PRN

## 2018-07-11 MED ORDER — HYDROCODONE-ACETAMINOPHEN 5-325 MG PO TABS: 1.0000 | ORAL_TABLET | ORAL | 0 refills | Status: DC | PRN

## 2018-07-11 MED FILL — ONDANSETRON ODT 4 MG TABLET: 4 | 3 days supply | Qty: 10 | Fill #0

## 2018-07-11 MED FILL — MECLIZINE 25 MG TABLET: 25 | 10 days supply | Qty: 30 | Fill #0

## 2018-07-11 MED FILL — HYDROCODON-APAP 5-325: 5-325 | 2 days supply | Qty: 10 | Fill #0

## 2018-07-11 NOTE — ED Triage Notes (Signed)
Pt c/o " migraine" x 1 week  

## 2018-07-11 NOTE — ED Provider Notes (Signed)
[facility] EMERGENCY DEPARTMENT Provider Note   CSN: [id number] Arrival date & time: 07/11/18  1257    History   Chief Complaint Chief Complaint  Patient presents with  . Headache    HPI Regina [name] is a 43 y.o. female.     Pt presents to the ED today with a migraine that has been going on for 1 week.  She has a hx of migraines.  Pt took a fiorcet at home without relief.  Pt has nausea, but no vomiting.  She feels dizzy also.     Past Medical History:  Diagnosis Date  . Asthma   . Hypertension   . Migraine     There are no active problems to display for this patient.   Past Surgical History:  Procedure Laterality Date  . ABDOMINAL HYSTERECTOMY    . CESAREAN SECTION    . CHOLECYSTECTOMY    . KNEE ARTHROSCOPY    . OVARIAN CYST REMOVAL       OB History    Gravida  1   Para  1   Term      Preterm      AB      Living        SAB      TAB      Ectopic      Multiple      Live Births               Home Medications    Prior to Admission medications   Medication Sig Start Date End Date Taking? Authorizing Provider  pantoprazole (PROTONIX) 40 MG tablet Take 40 mg by mouth daily.   Yes [provider]  albuterol (PROVENTIL HFA;VENTOLIN HFA) 108 (90 BASE) MCG/ACT inhaler Inhale into the lungs every 6 (six) hours as needed for wheezing or shortness of breath.    [provider]  amLODipine (NORVASC) 10 MG tablet Take 10 mg by mouth daily.    [provider]  beclomethasone (QVAR) 40 MCG/ACT inhaler Inhale into the lungs 2 (two) times daily.    [provider]  HYDROcodone-acetaminophen (NORCO/VICODIN) 5-325 MG tablet Take 1 tablet by mouth every 4 (four) hours as needed. 07/11/18   Jacalyn Lefevre, MD  meclizine (ANTIVERT) 25 MG tablet Take 1 tablet (25 mg total) by mouth 3 (three) times daily as needed for dizziness. 07/11/18   Jacalyn Lefevre, MD  ondansetron (ZOFRAN ODT) 4 MG disintegrating tablet  Take 1 tablet (4 mg total) by mouth every 8 (eight) hours as needed. 07/11/18   Jacalyn Lefevre, MD  sucralfate (CARAFATE) 1 g tablet Take one tablet 3 times daily with meals and again at bedtime. Take omeprazole at least 30 minutes before first dose of Carafate. 10/26/15   Molpus, John, MD  tiZANidine (ZANAFLEX) 4 MG tablet Take 4 mg by mouth every 6 (six) hours as needed for muscle spasms.    [provider]    Family History History reviewed. No pertinent family history.  Social History Social History   Tobacco Use  . Smoking status: Former Smoker    Last attempt to quit: 04/14/2012    Years since quitting: 6.2  . Smokeless tobacco: Never Used  Substance Use Topics  . Alcohol use: No  . Drug use: No     Allergies   Penicillins   Review of Systems Review of Systems  Neurological: Positive for headaches.     Physical Exam Updated Vital Signs BP (!) 128/94 (BP Location:  Right Arm)   Pulse 83   Temp 98.7 F (37.1 C) (Oral)   Resp 18   Ht 5\' 1"  (1.549 m)   Wt 108.9 kg   LMP 05/12/2016 (Approximate)   SpO2 99%   BMI 45.35 kg/m   Physical Exam Vitals signs and nursing note reviewed.  Constitutional:      Appearance: She is well-developed.  HENT:     Head: Normocephalic and atraumatic.     Mouth/Throat:     Mouth: Mucous membranes are moist.  Eyes:     Extraocular Movements: Extraocular movements intact.     Pupils: Pupils are equal, round, and reactive to light.  Neck:     Musculoskeletal: Normal range of motion and neck supple.  Cardiovascular:     Rate and Rhythm: Normal rate and regular rhythm.     Heart sounds: Normal heart sounds.  Pulmonary:     Effort: Pulmonary effort is normal.     Breath sounds: Normal breath sounds.  Abdominal:     General: Bowel sounds are normal.     Palpations: Abdomen is soft.  Musculoskeletal: Normal range of motion.  Skin:    General: Skin is warm and dry.     Capillary Refill: Capillary refill takes less than  2 seconds.  Neurological:     Mental Status: She is alert and oriented to person, place, and time.  Psychiatric:        Mood and Affect: Mood normal.        Speech: Speech normal.        Behavior: Behavior normal.      ED Treatments / Results  Labs (all labs ordered are listed, but only abnormal results are displayed) Labs Reviewed  BASIC METABOLIC PANEL - Abnormal; Notable for the following components:      Result Value   Sodium 134 (*)    Glucose, Bld 113 (*)    All other components within normal limits  PREGNANCY, URINE  CBC  URINALYSIS, ROUTINE W REFLEX MICROSCOPIC    EKG None  Radiology No results found.  Procedures Procedures (including critical care time)  Medications Ordered in ED Medications  sodium chloride 0.9 % bolus 1,000 mL (1,000 mLs Intravenous New Bag/Given 07/11/18 1401)    And  0.9 %  sodium chloride infusion (has no administration in time range)  metoCLOPramide (REGLAN) injection 10 mg (10 mg Intravenous Given 07/11/18 1404)  diphenhydrAMINE (BENADRYL) injection 12.5 mg (12.5 mg Intravenous Given 07/11/18 1402)  ketorolac (TORADOL) 15 MG/ML injection 15 mg (15 mg Intravenous Given 07/11/18 1401)  meclizine (ANTIVERT) tablet 25 mg (25 mg Oral Given 07/11/18 1422)  morphine 4 MG/ML injection 4 mg (4 mg Intravenous Given 07/11/18 1452)  LORazepam (ATIVAN) injection 1 mg (1 mg Intravenous Given 07/11/18 1452)     Initial Impression / Assessment and Plan / ED Course  I have reviewed the triage vital signs and the nursing notes.  Pertinent labs & imaging results that were available during my care of the patient were reviewed by me and considered in my medical decision making (see chart for details).      Pt is feeling better after the above treatment.  Pt will be d/c home with instructions to return if worse.  The pt is also given instructions on the home Epley maneuver to see if that helps with her dizziness.  Final Clinical Impressions(s) / ED  Diagnoses   Final diagnoses:  Migraine without status migrainosus, not intractable, unspecified migraine type  Vertigo  ED Discharge Orders         Ordered    meclizine (ANTIVERT) 25 MG tablet  3 times daily PRN     07/11/18 1502    ondansetron (ZOFRAN ODT) 4 MG disintegrating tablet  Every 8 hours PRN     07/11/18 1502    HYDROcodone-acetaminophen (NORCO/VICODIN) 5-325 MG tablet  Every 4 hours PRN     07/11/18 1502           Jacalyn Lefevre, MD 07/11/18 1504

## 2020-06-08 ENCOUNTER — Other Ambulatory Visit (HOSPITAL_BASED_OUTPATIENT_CLINIC_OR_DEPARTMENT_OTHER): Payer: Self-pay | Admitting: Student

## 2020-06-08 ENCOUNTER — Emergency Department (HOSPITAL_BASED_OUTPATIENT_CLINIC_OR_DEPARTMENT_OTHER): Payer: PRIVATE HEALTH INSURANCE

## 2020-06-08 ENCOUNTER — Encounter (HOSPITAL_BASED_OUTPATIENT_CLINIC_OR_DEPARTMENT_OTHER): Payer: Self-pay

## 2020-06-08 ENCOUNTER — Other Ambulatory Visit: Payer: Self-pay

## 2020-06-08 ENCOUNTER — Emergency Department (HOSPITAL_BASED_OUTPATIENT_CLINIC_OR_DEPARTMENT_OTHER)
Admission: EM | Admit: 2020-06-08 | Discharge: 2020-06-08 | Disposition: A | Discharge status: Home / Self Care | Payer: PRIVATE HEALTH INSURANCE | Attending: Emergency Medicine | Admitting: Emergency Medicine

## 2020-06-08 DIAGNOSIS — M5440 Lumbago with sciatica, unspecified side: Secondary | ICD-10-CM | POA: Diagnosis not present

## 2020-06-08 DIAGNOSIS — M5441 Lumbago with sciatica, right side: Secondary | ICD-10-CM

## 2020-06-08 DIAGNOSIS — Z87891 Personal history of nicotine dependence: Secondary | ICD-10-CM | POA: Diagnosis not present

## 2020-06-08 DIAGNOSIS — I10 Essential (primary) hypertension: Secondary | ICD-10-CM | POA: Diagnosis not present

## 2020-06-08 DIAGNOSIS — M545 Low back pain, unspecified: Secondary | ICD-10-CM | POA: Diagnosis present

## 2020-06-08 DIAGNOSIS — Z79899 Other long term (current) drug therapy: Secondary | ICD-10-CM | POA: Insufficient documentation

## 2020-06-08 DIAGNOSIS — M5442 Lumbago with sciatica, left side: Secondary | ICD-10-CM

## 2020-06-08 DIAGNOSIS — Z7951 Long term (current) use of inhaled steroids: Secondary | ICD-10-CM | POA: Diagnosis not present

## 2020-06-08 DIAGNOSIS — J45909 Unspecified asthma, uncomplicated: Secondary | ICD-10-CM | POA: Insufficient documentation

## 2020-06-08 MED ORDER — HYDROCODONE-ACETAMINOPHEN 5-325 MG PO TABS: 2.0000 | ORAL_TABLET | ORAL | 0 refills | Status: DC | PRN

## 2020-06-08 MED ORDER — OXYCODONE-ACETAMINOPHEN 5-325 MG PO TABS
1.0000 | ORAL_TABLET | Freq: Once | ORAL | Status: AC
  Administered 2020-06-08: 1 via ORAL
  Filled 2020-06-08: qty 1

## 2020-06-08 MED ORDER — KETOROLAC TROMETHAMINE 15 MG/ML IJ SOLN
30.0000 mg | Freq: Once | INTRAMUSCULAR | Status: AC
Start: 2020-06-08 — End: 2020-06-08
  Administered 2020-06-08: 30 mg via INTRAMUSCULAR
  Filled 2020-06-08: qty 2

## 2020-06-08 MED FILL — HYDROCODON-APAP 5-325: 5-325 | 2 days supply | Qty: 8 | Fill #0

## 2020-06-08 NOTE — ED Provider Notes (Signed)
[facility] EMERGENCY DEPARTMENT Provider Note   CSN: [id number] Arrival date & time: 06/08/20  0944     History Chief Complaint  Patient presents with  . Back Pain    Regina [name] is a 45 y.o. female.  HPI 45 year old female with a past medical history significant for degenerative disc disease, hypertension, obesity presents to the emergency department today for evaluation of low back pain.  Patient reports that she was at work last night and reached over change a trash can.  Patient reports that she had immediate pain to her lower back.  Patient reports that she will sometimes get sharp pain that radiates down to her tailbone however the pain is a constant dull throbbing pain.  Patient denies any numbness or tingling.  Denies any loss of bowel or bladder, saddle paresthesias or urinary retention.  She did try taking Flexeril, ibuprofen and topical lidocaine patches yesterday without any significant relief of symptoms.  No medications were given this morning for pain.  Patient reports that she tried to go to work however she was not able to bear the pain.  Patient rates the pain a 10/10.  She is able to ambulate but this does cause her pain.  Also bending and palpation causes the pain to worsen as well.  She is seen by pain management but she is not able to see them till next week.    Past Medical History:  Diagnosis Date  . Asthma   . Hypertension   . Migraine     There are no problems to display for this patient.   Past Surgical History:  Procedure Laterality Date  . ABDOMINAL HYSTERECTOMY    . CESAREAN SECTION    . CHOLECYSTECTOMY    . KNEE ARTHROSCOPY    . OVARIAN CYST REMOVAL       OB History    Gravida  1   Para  1   Term      Preterm      AB      Living        SAB      IAB      Ectopic      Multiple      Live Births              History reviewed. No pertinent family history.  Social History   Tobacco Use  . Smoking status:  Former Smoker    Quit date: 04/14/2012    Years since quitting: 8.1  . Smokeless tobacco: Never Used  Vaping Use  . Vaping Use: Never used  Substance Use Topics  . Alcohol use: Yes    Comment: occassion  . Drug use: No    Home Medications Prior to Admission medications   Medication Sig Start Date End Date Taking? Authorizing Provider  albuterol (PROVENTIL HFA;VENTOLIN HFA) 108 (90 BASE) MCG/ACT inhaler Inhale into the lungs every 6 (six) hours as needed for wheezing or shortness of breath.    [provider]  amLODipine (NORVASC) 10 MG tablet Take 10 mg by mouth daily.    [provider]  beclomethasone (QVAR) 40 MCG/ACT inhaler Inhale into the lungs 2 (two) times daily.    [provider]  HYDROcodone-acetaminophen (NORCO/VICODIN) 5-325 MG tablet Take 1 tablet by mouth every 4 (four) hours as needed. 07/11/18   Jacalyn Lefevre, MD  meclizine (ANTIVERT) 25 MG tablet Take 1 tablet (25 mg total) by mouth 3 (three) times daily as needed for dizziness. 07/11/18  Jacalyn Lefevre, MD  ondansetron (ZOFRAN ODT) 4 MG disintegrating tablet Take 1 tablet (4 mg total) by mouth every 8 (eight) hours as needed. 07/11/18   Jacalyn Lefevre, MD  pantoprazole (PROTONIX) 40 MG tablet Take 40 mg by mouth daily.    [provider]  sucralfate (CARAFATE) 1 g tablet Take one tablet 3 times daily with meals and again at bedtime. Take omeprazole at least 30 minutes before first dose of Carafate. 10/26/15   Molpus, John, MD  tiZANidine (ZANAFLEX) 4 MG tablet Take 4 mg by mouth every 6 (six) hours as needed for muscle spasms.    [provider]    Allergies    Penicillins  Review of Systems   Review of Systems  Constitutional: Negative for chills and fever.  HENT: Negative for congestion.   Eyes: Negative for discharge.  Respiratory: Negative for cough.   Gastrointestinal: Negative for vomiting.  Genitourinary: Negative for difficulty urinating.  Musculoskeletal:  Positive for arthralgias and back pain.  Skin: Negative for color change.  Neurological: Negative for weakness and numbness.  Psychiatric/Behavioral: Negative for confusion.    Physical Exam Updated Vital Signs BP (!) 129/91 (BP Location: Right Arm)   Pulse 90   Temp 98 F (36.7 C) (Oral)   Resp 20   Ht 5\' 2"  (1.575 m)   Wt 113.4 kg   LMP 05/12/2016 (Approximate)   SpO2 100%   BMI 45.73 kg/m   Physical Exam Vitals and nursing note reviewed.  Constitutional:      General: She is not in acute distress.    Appearance: She is well-developed and well-nourished. She is not ill-appearing or toxic-appearing.  HENT:     Head: Normocephalic and atraumatic.  Eyes:     General: No scleral icterus.       Right eye: No discharge.        Left eye: No discharge.  Cardiovascular:     Pulses: Normal pulses.  Pulmonary:     Effort: No respiratory distress.  Musculoskeletal:        General: Tenderness present. Normal range of motion.     Cervical back: Normal range of motion.     Right lower leg: No edema.     Left lower leg: No edema.     Comments: Patient with midline L-spine tenderness with some mild bilateral paraspinal tenderness that radiates into the bilateral buttocks.  Patient has positive straight leg raise test bilaterally that reproduces the pain in her lower back.  There are no step-offs or deformities.  Skin:    General: Skin is warm and dry.     Coloration: Skin is not pale.  Neurological:     Mental Status: She is alert.     Sensory: No sensory deficit.     Motor: No weakness.  Psychiatric:        Mood and Affect: Mood normal.        Behavior: Behavior normal.     ED Results / Procedures / Treatments   Labs (all labs ordered are listed, but only abnormal results are displayed) Labs Reviewed - No data to display  EKG None  Radiology DG Lumbar Spine Complete  Result Date: 06/08/2020 CLINICAL DATA:  Back pain EXAM: LUMBAR SPINE - COMPLETE 4+ VIEW COMPARISON:   None. FINDINGS: There is no evidence of lumbar spine fracture. Alignment is normal. Mild dextroscoliosis. Intervertebral disc spaces are maintained. IMPRESSION: Negative. Electronically Signed   By: 06/10/2020 M.D.   On: 06/08/2020 10:58  Procedures Procedures   Medications Ordered in ED Medications  ketorolac (TORADOL) 15 MG/ML injection 30 mg (has no administration in time range)  oxyCODONE-acetaminophen (PERCOCET/ROXICET) 5-325 MG per tablet 1 tablet (has no administration in time range)    ED Course  I have reviewed the triage vital signs and the nursing notes.  Pertinent labs & imaging results that were available during my care of the patient were reviewed by me and considered in my medical decision making (see chart for details).    MDM Rules/Calculators/A&P                          45 year old presents the ER for low back pain after bending over last night.  Patient denies any red flag symptoms concerning for cauda equina.  Doubt epidural abscess.  Patient is neurovascularly intact without any focal neurological deficit.  Will obtain x-ray to evaluate for the lumbar spine.  Will treat pain in the ER.  Will reassess.  X-ray reviewed that shows no evidence of fracture.  This is likely a sprain versus herniated disc.  Have given patient stretches to perform at home.  Patient will be given short course of pain medication.  The review patient's narcotic database for the past 6 months and she has had no controlled drugs prescribed.  Encourage PCP follow-up.  Pt is hemodynamically stable, in NAD, & able to ambulate in the ED. Evaluation does not show pathology that would require ongoing emergent intervention or inpatient treatment. I explained the diagnosis to the patient. Pain has been managed & has no complaints prior to dc. Pt is comfortable with above plan and is stable for discharge at this time. All questions were answered prior to disposition. Strict return precautions for f/u  to the ED were discussed. Encouraged follow up with PCP.   Final Clinical Impression(s) / ED Diagnoses Final diagnoses:  Acute midline low back pain with bilateral sciatica    Rx / DC Orders ED Discharge Orders         Ordered    HYDROcodone-acetaminophen (NORCO/VICODIN) 5-325 MG tablet  Every 4 hours PRN        06/08/20 1109           Wallace Keller 06/08/20 1111    Pricilla Loveless, MD 06/10/20 [phone]

## 2020-06-08 NOTE — Discharge Instructions (Addendum)
Your x-ray does not show any signs of a fracture.  If your pain persist you may need to have an MRI at some point.  Please follow-up with your pain management doctor or your primary care doctor.  Perform the stretches as provided.  Anti-inflammatories will help as well.  Warm compresses along with topical lidocaine patches.If you lose the ability to control your bowel or bladder have any numbness in your groin area or inability to urinate return to the ER.

## 2020-06-08 NOTE — ED Triage Notes (Signed)
Pt was cleaning room and had sudden lumbar back pain. Worse with movement. Denies numbness/tingling. Took ibuprofen, otc pain patch, muscle relaxer and heating pad without relief.

## 2020-06-08 NOTE — ED Notes (Signed)
Pt knows she is NOT to drive with pain meds on board  X 8 hours

## 2021-01-24 ENCOUNTER — Encounter (HOSPITAL_BASED_OUTPATIENT_CLINIC_OR_DEPARTMENT_OTHER): Payer: Self-pay | Admitting: *Deleted

## 2021-01-24 ENCOUNTER — Emergency Department (HOSPITAL_BASED_OUTPATIENT_CLINIC_OR_DEPARTMENT_OTHER): Payer: PRIVATE HEALTH INSURANCE

## 2021-01-24 ENCOUNTER — Emergency Department (HOSPITAL_BASED_OUTPATIENT_CLINIC_OR_DEPARTMENT_OTHER)
Admission: EM | Admit: 2021-01-24 | Discharge: 2021-01-24 | Disposition: A | Discharge status: Home / Self Care | Payer: PRIVATE HEALTH INSURANCE | Attending: Emergency Medicine | Admitting: Emergency Medicine

## 2021-01-24 ENCOUNTER — Other Ambulatory Visit: Payer: Self-pay

## 2021-01-24 DIAGNOSIS — M25531 Pain in right wrist: Secondary | ICD-10-CM | POA: Insufficient documentation

## 2021-01-24 DIAGNOSIS — Z5321 Procedure and treatment not carried out due to patient leaving prior to being seen by health care provider: Secondary | ICD-10-CM | POA: Insufficient documentation

## 2021-01-24 NOTE — ED Triage Notes (Signed)
C/o fall x 1 month ago , c/o middle finger pain and decreased movt
